# Patient Record
Sex: Male | Born: 1970 | Race: White | Hispanic: No | Marital: Single | Smoking: Never smoker
Health system: Southern US, Community
[De-identification: ages and names within clinical notes are randomized; demographics above are authoritative.]

---

## 2013-10-30 ENCOUNTER — Emergency Department (HOSPITAL_COMMUNITY)
Admission: EM | Admit: 2013-10-30 | Discharge: 2013-10-31 | Disposition: A | Discharge status: Home / Self Care | Payer: 59 | Attending: Emergency Medicine | Admitting: Emergency Medicine

## 2013-10-30 ENCOUNTER — Emergency Department (HOSPITAL_COMMUNITY): Payer: 59

## 2013-10-30 ENCOUNTER — Encounter (HOSPITAL_COMMUNITY): Payer: Self-pay | Admitting: Emergency Medicine

## 2013-10-30 DIAGNOSIS — N50819 Testicular pain, unspecified: Secondary | ICD-10-CM

## 2013-10-30 DIAGNOSIS — Z880 Allergy status to penicillin: Secondary | ICD-10-CM | POA: Insufficient documentation

## 2013-10-30 DIAGNOSIS — N509 Disorder of male genital organs, unspecified: Secondary | ICD-10-CM | POA: Insufficient documentation

## 2013-10-30 NOTE — ED Notes (Signed)
Ultrasound here for procedure

## 2013-10-30 NOTE — ED Notes (Signed)
Pt complains left testicle pain intermittently for one month but much worse today, pt complains of an aching pain and some swelling, no injury noted

## 2013-10-30 NOTE — ED Provider Notes (Signed)
CSN: [id number]     Arrival date & time 10/30/13  1914 History   First MD Initiated Contact with Patient 10/30/13 2348     Chief Complaint  Patient presents with  . Testicle Pain   (Consider location/radiation/quality/duration/timing/severity/associated sxs/prior Treatment) The history is provided by the patient and medical records.   This is a 43 year old male with no significant past history presenting to the ED for left testicle pain. Patient states he's had a aching pain in his left testicle intermittently over the past month, but was much worse today after getting out of the shower.  States today his testicle felt swollen.  Denies recent injuries.  No urinary sx.  No urethral discharge.  No new sexual partners or concern for STD.  No flank pain, fevers, or chills.  No prior urologic history.  VS stable on arrival.  History reviewed. No pertinent past medical history. History reviewed. No pertinent past surgical history. History reviewed. No pertinent family history. History  Substance Use Topics  . Smoking status: Never Smoker   . Smokeless tobacco: Not on file  . Alcohol Use: No    Review of Systems  Genitourinary: Positive for testicular pain.  All other systems reviewed and are negative.   Allergies  Penicillins  Home Medications  No current outpatient prescriptions on file. BP 135/85  Pulse 73  Temp(Src) 98.3 F (36.8 C) (Oral)  Resp 18  Ht 6\' 2"  (1.88 m)  Wt 272 lb (123.378 kg)  BMI 34.91 kg/m2  SpO2 98%  Physical Exam  Nursing note and vitals reviewed. Constitutional: He is oriented to person, place, and time. He appears well-developed and well-nourished. No distress.  HENT:  Head: Normocephalic and atraumatic.  Mouth/Throat: Oropharynx is clear and moist.  Eyes: Conjunctivae and EOM are normal. Pupils are equal, round, and reactive to light.  Neck: Normal range of motion.  Cardiovascular: Normal rate, regular rhythm and normal heart sounds.    Pulmonary/Chest: Effort normal and breath sounds normal. No respiratory distress. He has no wheezes.  Abdominal: Soft. Bowel sounds are normal. There is no tenderness. There is no guarding and no CVA tenderness. Hernia confirmed negative in the right inguinal area and confirmed negative in the left inguinal area.  Genitourinary: Penis normal. Right testis shows no swelling and no tenderness. Left testis shows no swelling and no tenderness. Circumcised. No penile erythema or penile tenderness. No discharge found.  Small cysts noted to posterior left testes; mobile and non-tender; no signs of cellulitis or abscess formation  Musculoskeletal: Normal range of motion.  Lymphadenopathy:       Right: No inguinal adenopathy present.       Left: No inguinal adenopathy present.  Neurological: He is alert and oriented to person, place, and time.  Skin: Skin is warm and dry. He is not diaphoretic.  Psychiatric: He has a normal mood and affect.    ED Course  Procedures (including critical care time) Labs Review Labs Reviewed  URINALYSIS, ROUTINE W REFLEX MICROSCOPIC   Imaging Review [place] Scrotum  10/30/2013   CLINICAL DATA:  Testicular pain.  EXAM: SCROTAL ULTRASOUND  DOPPLER ULTRASOUND OF THE TESTICLES  TECHNIQUE: Complete ultrasound examination of the testicles, epididymis, and other scrotal structures was performed. Color and spectral Doppler ultrasound were also utilized to evaluate blood flow to the testicles.  COMPARISON:  None.  FINDINGS: Right testicle  Measurements: 4.9 x 3.1 x 3.1 cm. No mass or microlithiasis visualized.  Left testicle  Measurements: 4.8 x 3.2 x 3.1 cm.  No mass or microlithiasis visualized.  Right epididymis: Multiple cysts are seen at the epididymal head, measuring up to 1.2 cm in size.  Left epididymis:  Normal in size and appearance.  Hydrocele:  A trace left-sided hydrocele is noted.  Varicocele: A small right-sided varicocele is seen, with augmentation on Valsalva maneuver.   Pulsed Doppler interrogation of both testes demonstrates low resistance arterial and venous waveforms bilaterally.  IMPRESSION: 1. No evidence of testicular torsion. The testes are unremarkable in appearance. 2. Small right-sided varicocele noted, with augmentation on Valsalva maneuver. 3. Several small epididymal head cysts seen, measuring up to 1.2 cm in size. 4. Trace left-sided hydrocele seen.   Electronically Signed   By: Roanna Raider M.D.   On: 10/30/2013 22:15   [place] Art/ven Flow Abd Pelv Doppler  10/30/2013   CLINICAL DATA:  Testicular pain.  EXAM: SCROTAL ULTRASOUND  DOPPLER ULTRASOUND OF THE TESTICLES  TECHNIQUE: Complete ultrasound examination of the testicles, epididymis, and other scrotal structures was performed. Color and spectral Doppler ultrasound were also utilized to evaluate blood flow to the testicles.  COMPARISON:  None.  FINDINGS: Right testicle  Measurements: 4.9 x 3.1 x 3.1 cm. No mass or microlithiasis visualized.  Left testicle  Measurements: 4.8 x 3.2 x 3.1 cm. No mass or microlithiasis visualized.  Right epididymis: Multiple cysts are seen at the epididymal head, measuring up to 1.2 cm in size.  Left epididymis:  Normal in size and appearance.  Hydrocele:  A trace left-sided hydrocele is noted.  Varicocele: A small right-sided varicocele is seen, with augmentation on Valsalva maneuver.  Pulsed Doppler interrogation of both testes demonstrates low resistance arterial and venous waveforms bilaterally.  IMPRESSION: 1. No evidence of testicular torsion. The testes are unremarkable in appearance. 2. Small right-sided varicocele noted, with augmentation on Valsalva maneuver. 3. Several small epididymal head cysts seen, measuring up to 1.2 cm in size. 4. Trace left-sided hydrocele seen.   Electronically Signed   By: Roanna Raider M.D.   On: 10/30/2013 22:15    EKG Interpretation   None       MDM   Final diagnoses:  Testicular pain   At time of evaluation, patient states pain  has resolved. No gross abnormalities noted on physical exam.  Pt denies possibility of STDs at this time.  No signs/sx concerning for fourniers gangrene at this time.  Ultrasound is negative for testicular torsion, or epididymitis. Patient does have multiple small epididymal cysts.  UA negative for infection.  Will get short supply of pain medication. Patient is returning to [place] tomorrow morning, encouraged him to follow up with a urologist if symptoms recur.  Discussed plan with pt, he acknowledged understanding and agreed with plan of care.  Garlon Hatchet, PA-C 10/31/13 0051  Garlon Hatchet, PA-C 10/31/13 [phone]

## 2013-10-31 LAB — URINALYSIS, ROUTINE W REFLEX MICROSCOPIC
Bilirubin Urine: NEGATIVE
GLUCOSE, UA: NEGATIVE mg/dL
Hgb urine dipstick: NEGATIVE
KETONES UR: NEGATIVE mg/dL
LEUKOCYTES UA: NEGATIVE
NITRITE: NEGATIVE
PH: 7 (ref 5.0–8.0)
Protein, ur: NEGATIVE mg/dL
SPECIFIC GRAVITY, URINE: 1.01 (ref 1.005–1.030)
Urobilinogen, UA: 0.2 mg/dL (ref 0.0–1.0)

## 2013-10-31 MED ORDER — TRAMADOL HCL 50 MG PO TABS: 50.0000 mg | ORAL_TABLET | Freq: Four times a day (QID) | ORAL | Status: AC | PRN

## 2013-10-31 NOTE — Discharge Instructions (Signed)
Take the prescribed medication as directed.  May take with tylenol for optimal pain relief. Follow-up with a urologist in your area if symptoms recur. Return to the ED for new or worsening symptoms.

## 2013-10-31 NOTE — ED Provider Notes (Signed)
Medical screening examination/treatment/procedure(s) were performed by non-physician practitioner and as supervising physician I was immediately available for consultation/collaboration.    Brit Carbonell, MD 10/31/13 0706 

## 2014-11-15 IMAGING — US US ART/VEN ABD/PELV/SCROTUM DOPPLER LTD
1 series · 13 of 25 positions shown · non-contrast
Comparison: None.

CLINICAL DATA: Testicular pain.

EXAM:
SCROTAL ULTRASOUND
DOPPLER ULTRASOUND OF THE TESTICLES
TECHNIQUE: Complete ultrasound examination of the testicles, epididymis, and
other scrotal structures was performed. Color and spectral Doppler
ultrasound were also utilized to evaluate blood flow to the
testicles.

[Series 1: us art/ven abd/pelv/scrotum doppler ltd · 0.07mm/px · 13 of 38 slices shown]
[im 1/38]
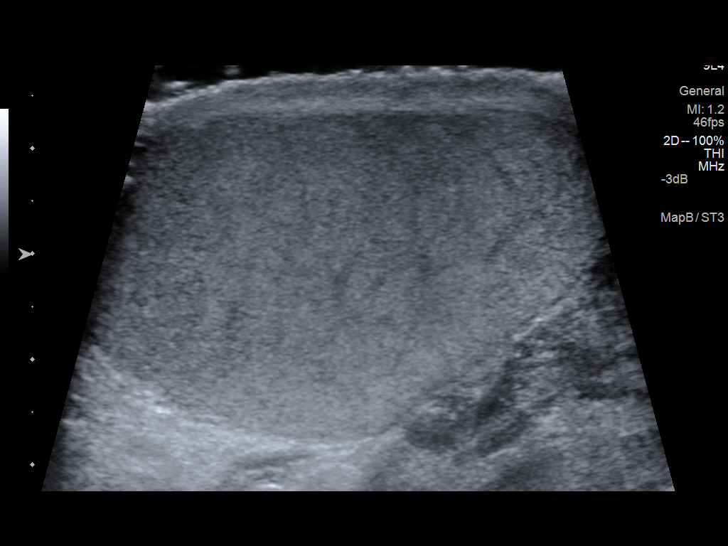
[im 4/38]
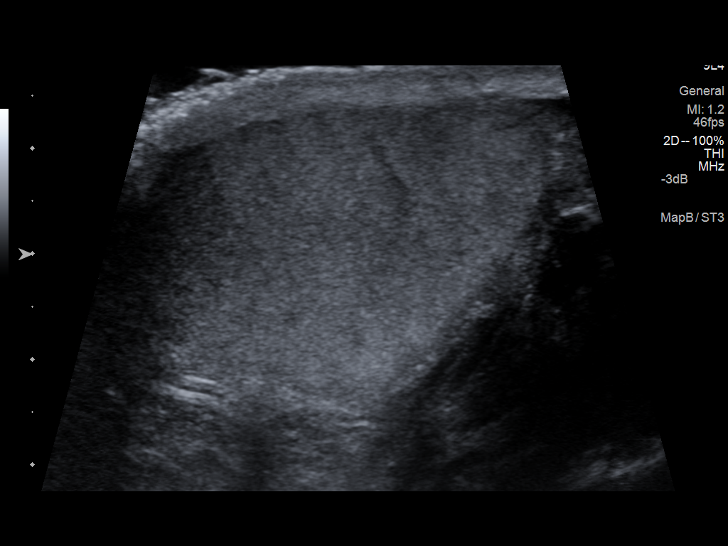
[im 7/38]
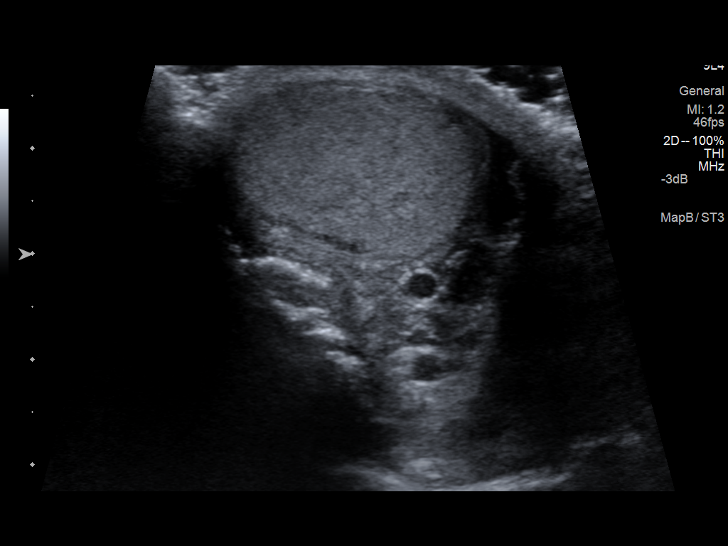
[im 10/38]
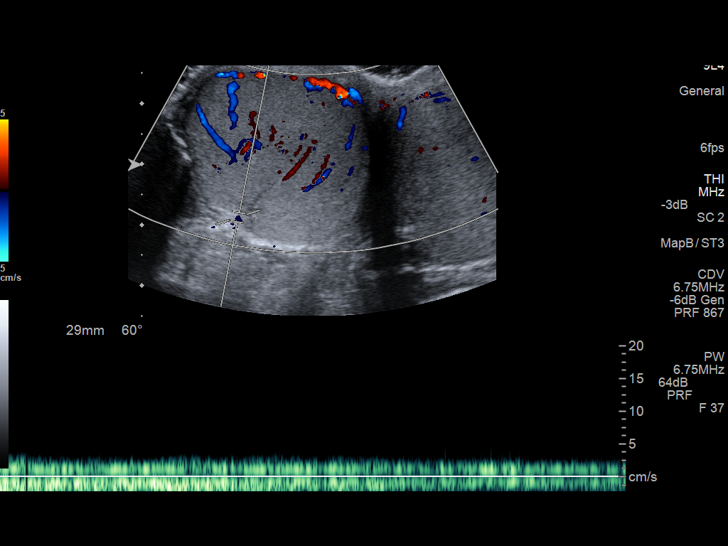
[im 13/38]
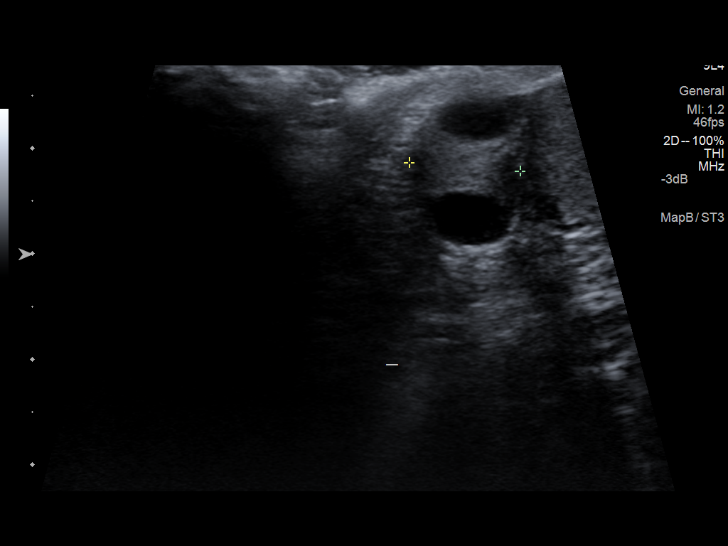
[im 16/38]
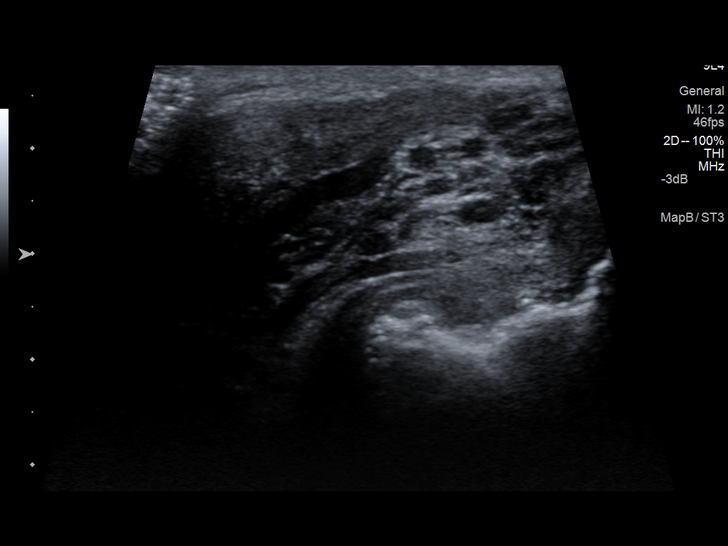
[im 19/38]
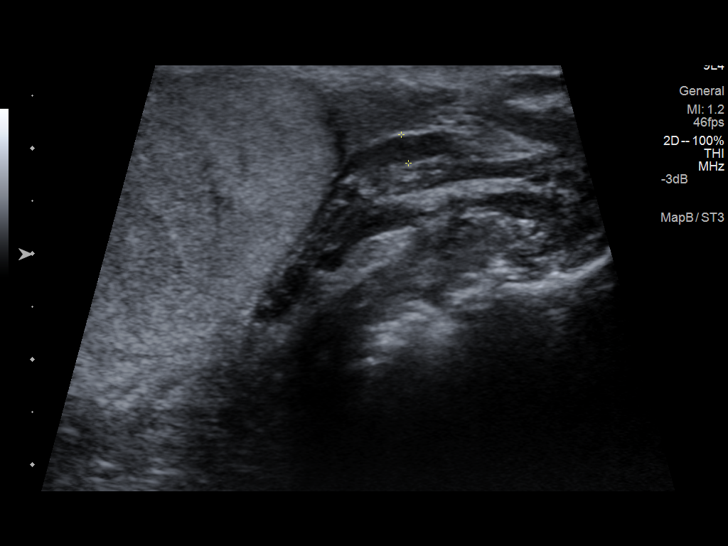
[im 22/38]
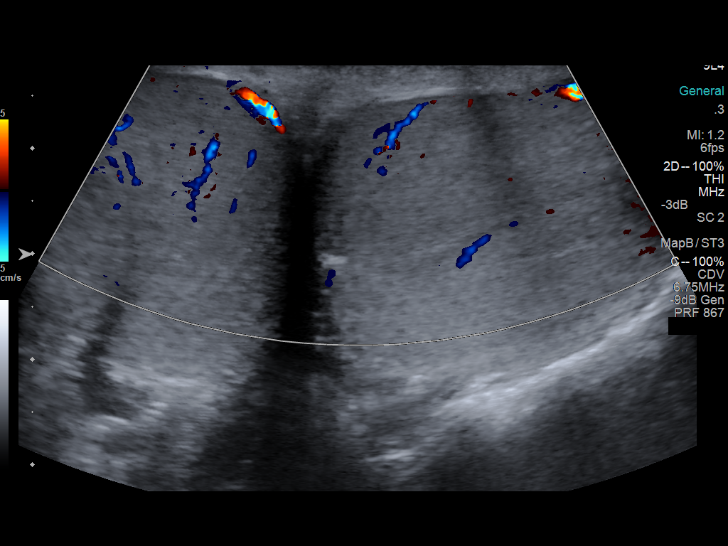
[im 25/38]
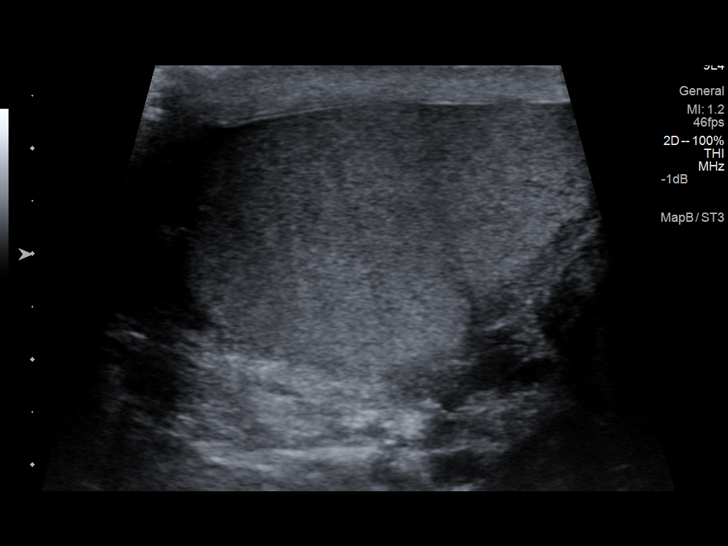
[im 28/38]
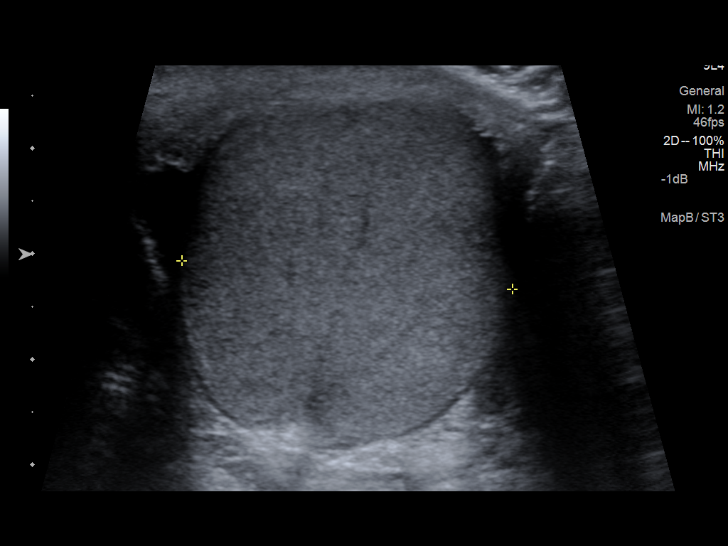
[im 31/38]
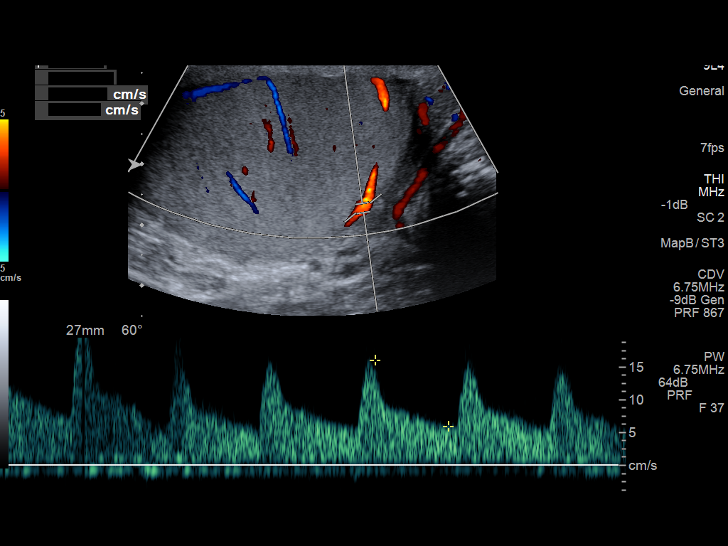
[im 34/38]
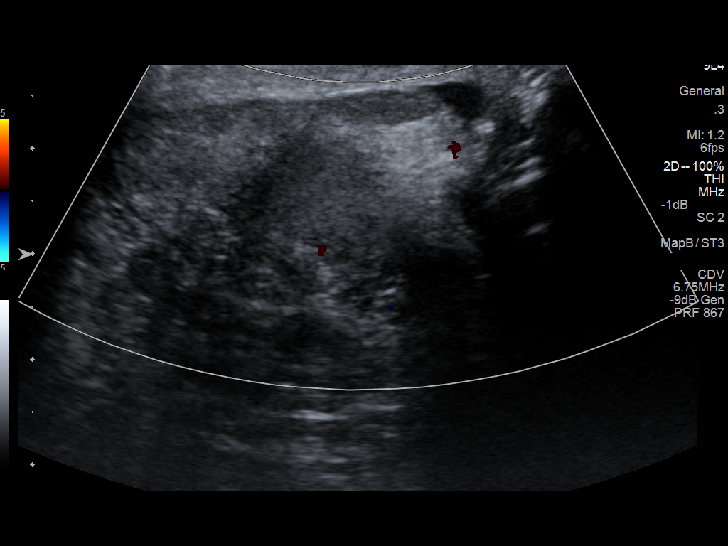
[im 38/38]
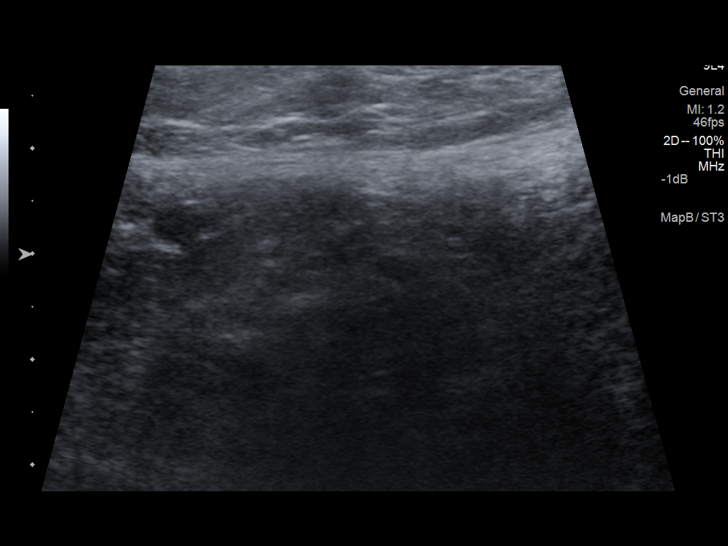

[13 of 25 positions shown; findings below may reference images not displayed]

FINDINGS: Right testicle

Measurements: 4.9 x 3.1 x 3.1 cm. No mass or microlithiasis
visualized.

Left testicle

Measurements: 4.8 x 3.2 x 3.1 cm. No mass or microlithiasis
visualized.

Right epididymis: Multiple cysts are seen at the epididymal head,
measuring up to 1.2 cm in size.

Left epididymis:  Normal in size and appearance.

Hydrocele:  A trace left-sided hydrocele is noted.

Varicocele: A small right-sided varicocele is seen, with
augmentation on Valsalva maneuver.

Pulsed Doppler interrogation of both testes demonstrates low
resistance arterial and venous waveforms bilaterally.
IMPRESSION: 1. No evidence of testicular torsion. The testes are unremarkable in
appearance.
2. Small right-sided varicocele noted, with augmentation on Valsalva
maneuver.
3. Several small epididymal head cysts seen, measuring up to 1.2 cm
in size.
4. Trace left-sided hydrocele seen.
